# Patient Record
Sex: Male | Born: 1960
Health system: Southern US, Community
[De-identification: ages and names within clinical notes are randomized; demographics above are authoritative.]

## PROBLEM LIST (undated history)

## (undated) DIAGNOSIS — C73 Malignant neoplasm of thyroid gland: Secondary | ICD-10-CM

## (undated) DIAGNOSIS — E785 Hyperlipidemia, unspecified: Secondary | ICD-10-CM

## (undated) DIAGNOSIS — Z8739 Personal history of other diseases of the musculoskeletal system and connective tissue: Secondary | ICD-10-CM

## (undated) DIAGNOSIS — I493 Ventricular premature depolarization: Secondary | ICD-10-CM

## (undated) HISTORY — DX: Personal history of other diseases of the musculoskeletal system and connective tissue: Z87.39

## (undated) HISTORY — DX: Malignant neoplasm of thyroid gland: C73

## (undated) HISTORY — DX: Hyperlipidemia, unspecified: E78.5

## (undated) HISTORY — PX: FINGER SURGERY: SHX640

## (undated) HISTORY — DX: Ventricular premature depolarization: I49.3

---

## 1997-10-01 ENCOUNTER — Ambulatory Visit (HOSPITAL_BASED_OUTPATIENT_CLINIC_OR_DEPARTMENT_OTHER): Admission: RE | Admit: 1997-10-01 | Discharge: 1997-10-01 | Payer: Self-pay | Admitting: Anesthesiology

## 1998-05-24 HISTORY — PX: THYROID SURGERY: SHX805

## 1998-08-19 ENCOUNTER — Ambulatory Visit (HOSPITAL_COMMUNITY): Admission: RE | Admit: 1998-08-19 | Discharge: 1998-08-19 | Payer: Self-pay

## 1998-09-08 ENCOUNTER — Ambulatory Visit (HOSPITAL_COMMUNITY): Admission: RE | Admit: 1998-09-08 | Discharge: 1998-09-10 | Payer: Self-pay

## 1998-09-14 ENCOUNTER — Emergency Department (HOSPITAL_COMMUNITY): Admission: EM | Admit: 1998-09-14 | Discharge: 1998-09-14 | Payer: Self-pay | Admitting: Emergency Medicine

## 1998-09-15 ENCOUNTER — Ambulatory Visit (HOSPITAL_COMMUNITY): Admission: RE | Admit: 1998-09-15 | Discharge: 1998-09-15 | Payer: Self-pay | Admitting: General Surgery

## 1998-10-28 ENCOUNTER — Ambulatory Visit (HOSPITAL_COMMUNITY): Admission: RE | Admit: 1998-10-28 | Discharge: 1998-10-28 | Payer: Self-pay | Admitting: Endocrinology

## 1998-10-31 ENCOUNTER — Encounter: Payer: Self-pay | Admitting: Endocrinology

## 1998-11-07 ENCOUNTER — Encounter: Payer: Self-pay | Admitting: Endocrinology

## 1998-11-07 ENCOUNTER — Ambulatory Visit (HOSPITAL_COMMUNITY): Admission: RE | Admit: 1998-11-07 | Discharge: 1998-11-07 | Payer: Self-pay | Admitting: Endocrinology

## 1999-10-26 ENCOUNTER — Ambulatory Visit (HOSPITAL_COMMUNITY): Admission: RE | Admit: 1999-10-26 | Discharge: 1999-10-26 | Payer: Self-pay | Admitting: *Deleted

## 1999-10-30 ENCOUNTER — Ambulatory Visit (HOSPITAL_COMMUNITY): Admission: RE | Admit: 1999-10-30 | Discharge: 1999-10-30 | Payer: Self-pay | Admitting: Endocrinology

## 2000-12-12 ENCOUNTER — Encounter: Payer: Self-pay | Admitting: Family Medicine

## 2000-12-12 ENCOUNTER — Ambulatory Visit (HOSPITAL_COMMUNITY): Admission: RE | Admit: 2000-12-12 | Discharge: 2000-12-12 | Payer: Self-pay | Admitting: Family Medicine

## 2002-02-05 ENCOUNTER — Encounter: Payer: Self-pay | Admitting: Family Medicine

## 2002-02-05 ENCOUNTER — Ambulatory Visit (HOSPITAL_COMMUNITY): Admission: RE | Admit: 2002-02-05 | Discharge: 2002-02-05 | Payer: Self-pay | Admitting: Family Medicine

## 2003-06-10 ENCOUNTER — Ambulatory Visit (HOSPITAL_COMMUNITY): Admission: RE | Admit: 2003-06-10 | Discharge: 2003-06-10 | Payer: Self-pay | Admitting: Family Medicine

## 2003-07-30 ENCOUNTER — Ambulatory Visit (HOSPITAL_COMMUNITY): Admission: RE | Admit: 2003-07-30 | Discharge: 2003-07-30 | Payer: Self-pay | Admitting: Endocrinology

## 2003-07-30 IMAGING — US US SOFT TISSUE HEAD/NECK
1 series · 14 of 17 positions shown · non-contrast
Comparison: none

CLINICAL DATA: History of thyroidectomy for thyroid cancer. 
 ULTRASOUND SOFT TISSUE NECK
 No definite residual thyroid tissue is identified.  No neck masses or enlarged lymph nodes are seen.  
 IMPRESSION
 Unremarkable soft tissue examination of the neck.  No evidence for residual thyroid tissue or thyroid bed mass or adenopathy.

[Series 1: unknown · 0.09mm/px · 14 of 17 slices shown]
[im 1/17]
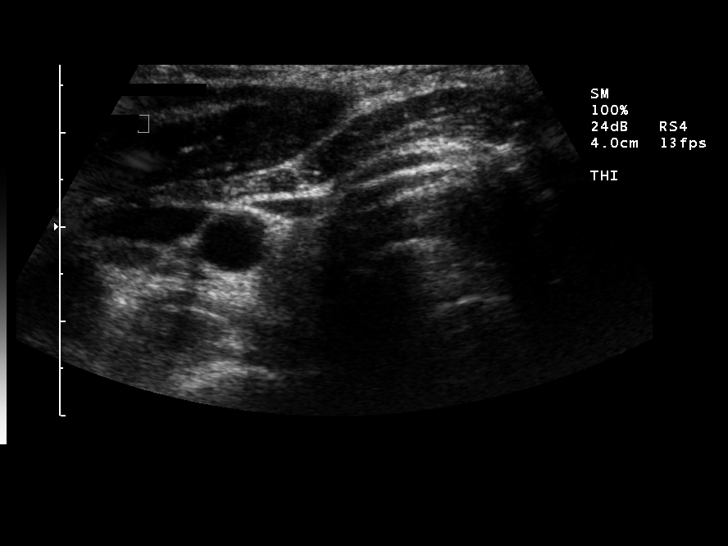
[im 2/17]
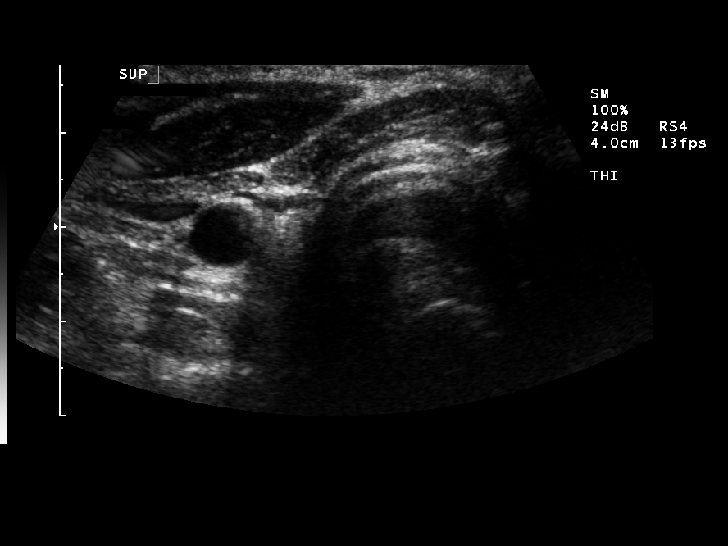
[im 4/17]
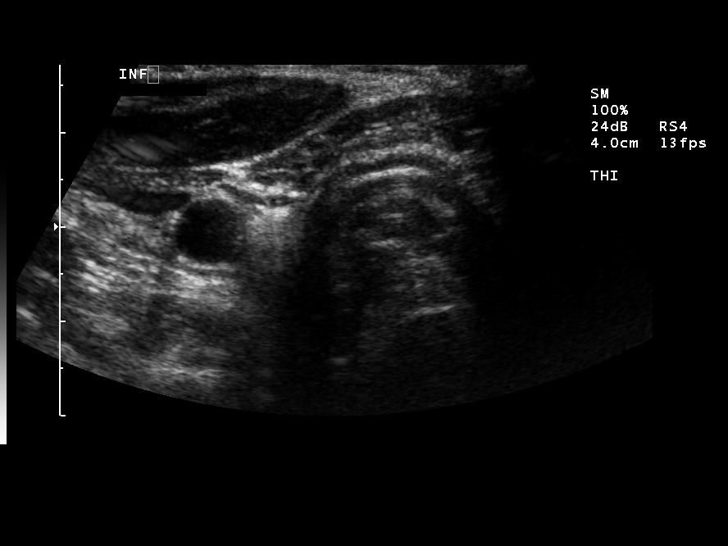
[im 5/17]
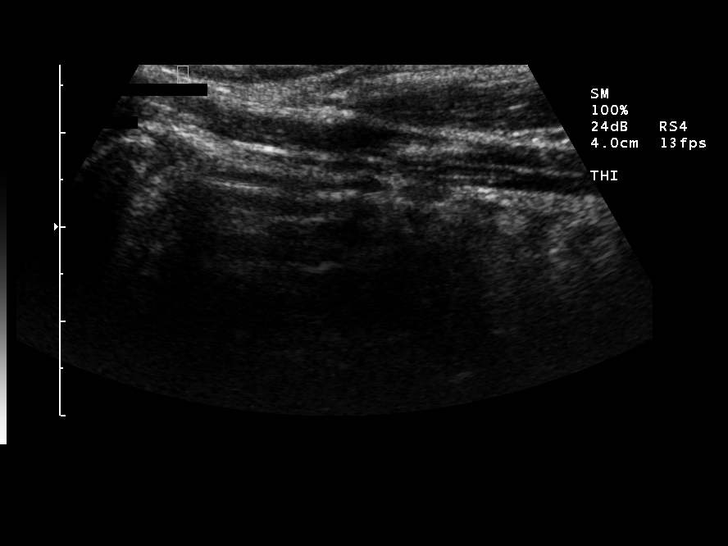
[im 6/17]
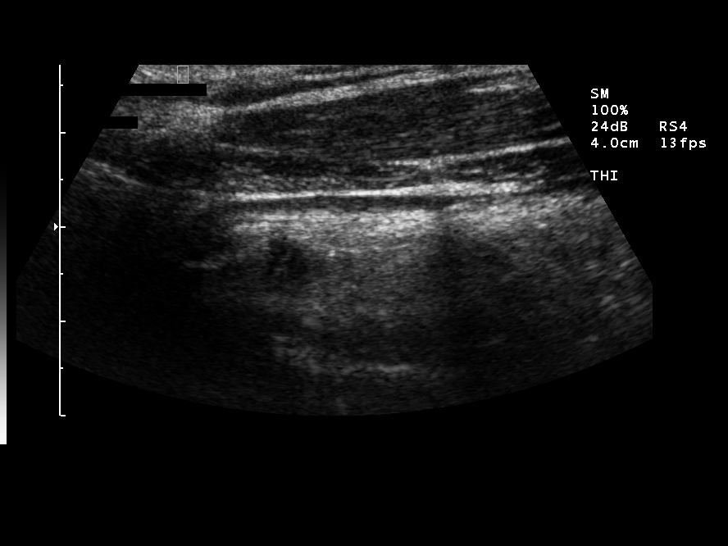
[im 7/17]
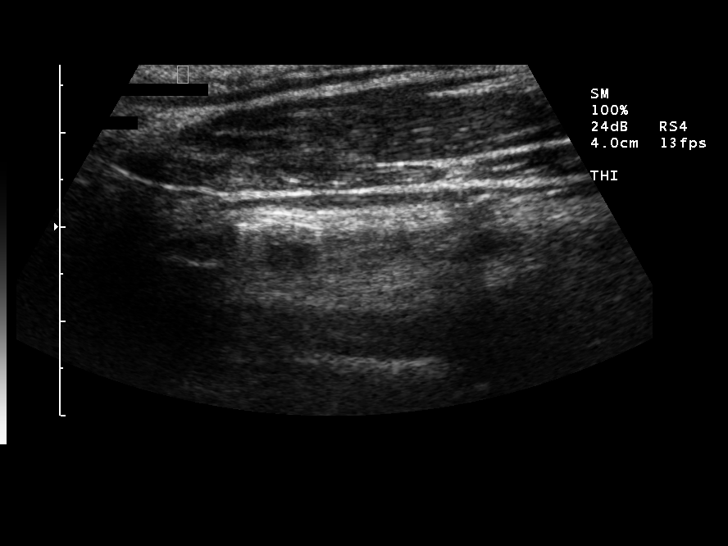
[im 8/17]
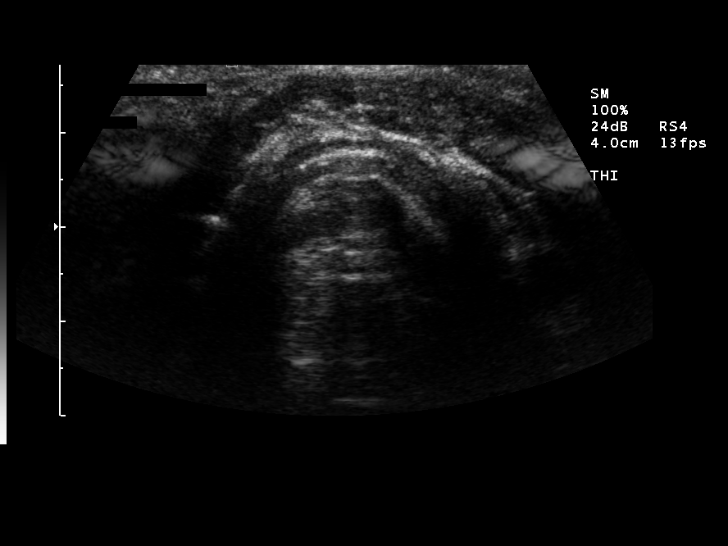
[im 10/17]
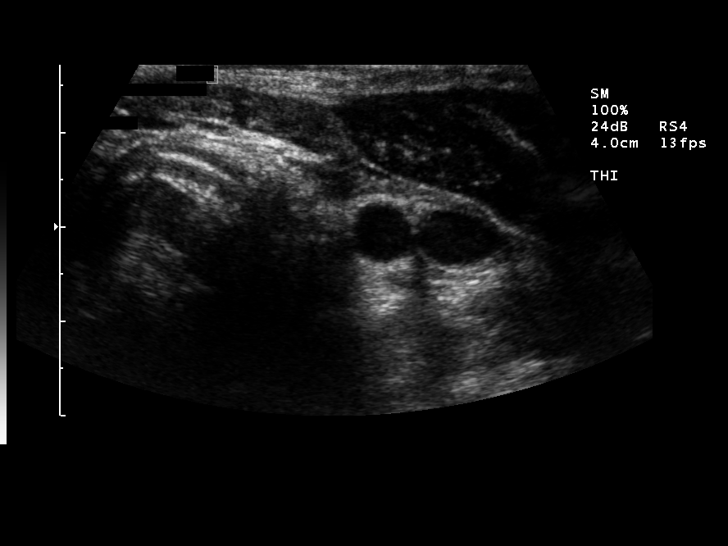
[im 11/17]
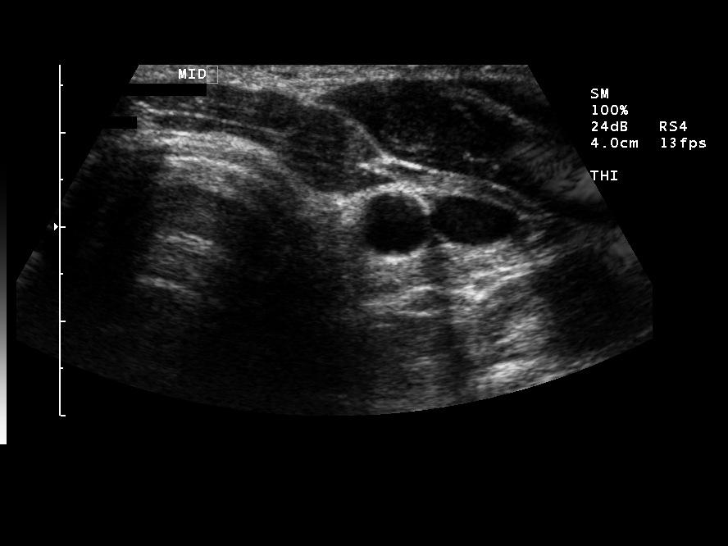
[im 12/17]
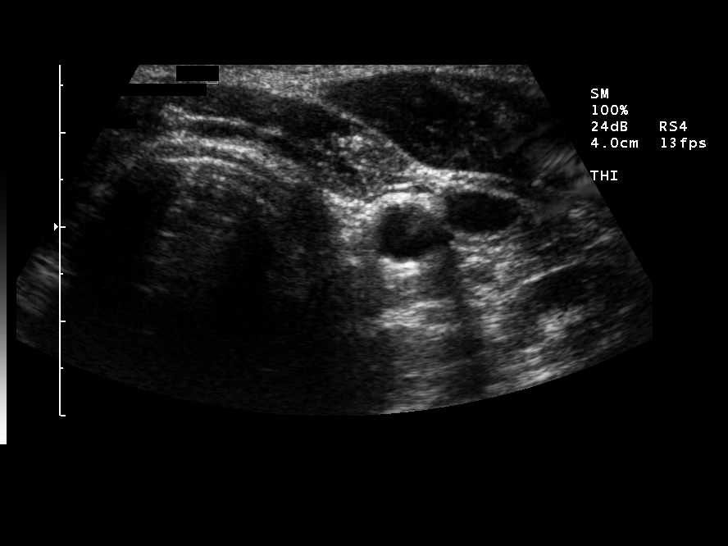
[im 13/17]
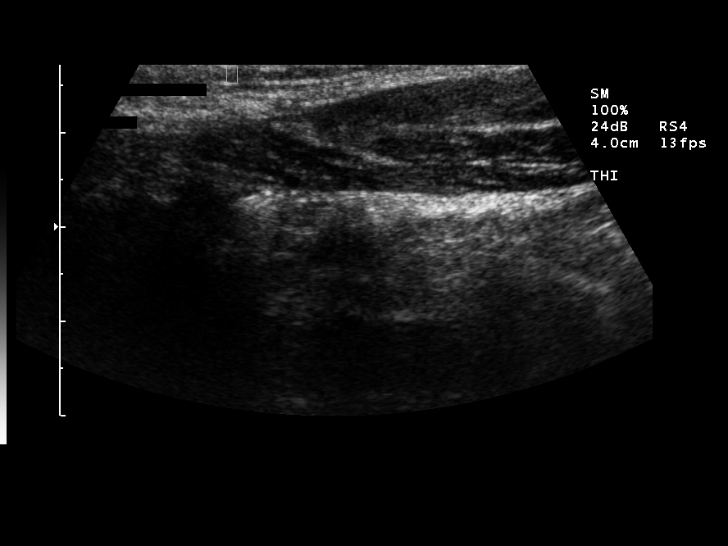
[im 14/17]
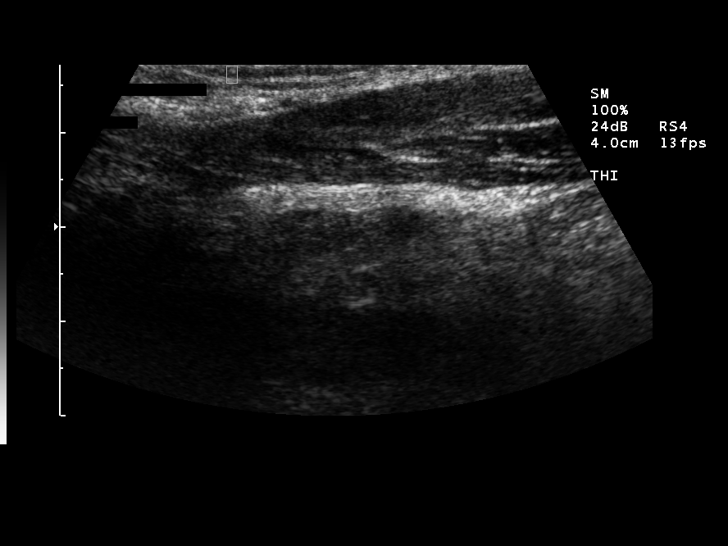
[im 16/17]
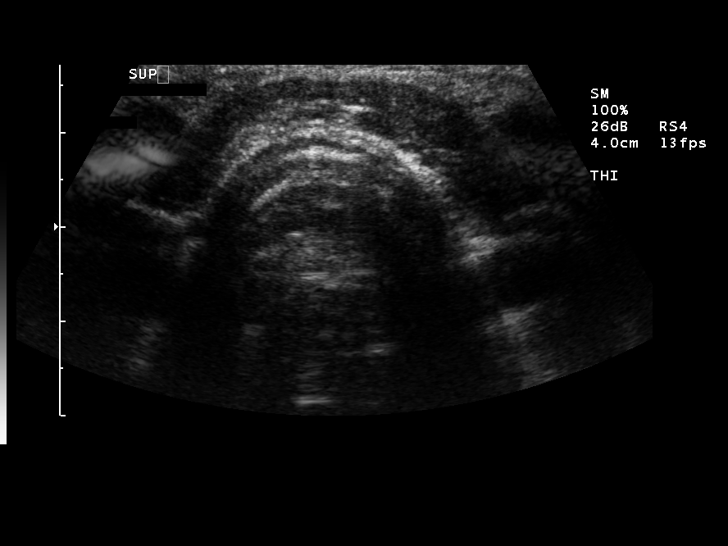
[im 17/17]
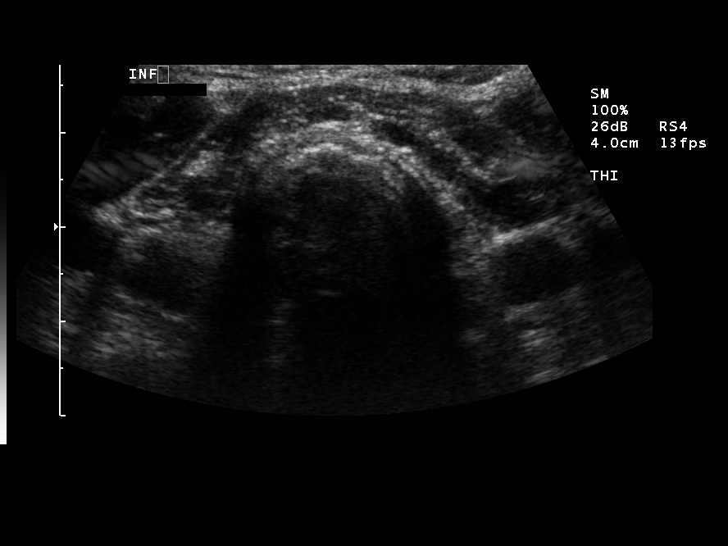

[14 of 17 positions shown; findings below may reference images not displayed]

## 2005-02-01 ENCOUNTER — Encounter (HOSPITAL_COMMUNITY): Admission: RE | Admit: 2005-02-01 | Discharge: 2005-02-16 | Payer: Self-pay | Admitting: Endocrinology

## 2005-05-24 HISTORY — PX: SHOULDER ARTHROSCOPY: SHX128

## 2006-10-12 ENCOUNTER — Encounter: Admission: RE | Admit: 2006-10-12 | Discharge: 2006-10-12 | Payer: Self-pay | Admitting: Endocrinology

## 2008-12-25 ENCOUNTER — Emergency Department (HOSPITAL_BASED_OUTPATIENT_CLINIC_OR_DEPARTMENT_OTHER): Admission: EM | Admit: 2008-12-25 | Discharge: 2008-12-25 | Payer: Self-pay | Admitting: Emergency Medicine

## 2010-06-13 ENCOUNTER — Encounter: Payer: Self-pay | Admitting: Family Medicine

## 2010-06-14 ENCOUNTER — Encounter: Payer: Self-pay | Admitting: Endocrinology

## 2010-06-15 ENCOUNTER — Encounter: Payer: Self-pay | Admitting: Endocrinology

## 2010-08-29 LAB — BASIC METABOLIC PANEL
BUN: 16 mg/dL (ref 6–23)
Chloride: 102 mEq/L (ref 96–112)
Creatinine, Ser: 0.9 mg/dL (ref 0.4–1.5)
Glucose, Bld: 125 mg/dL — ABNORMAL HIGH (ref 70–99)
Potassium: 3.7 mEq/L (ref 3.5–5.1)

## 2012-11-20 ENCOUNTER — Other Ambulatory Visit: Payer: Self-pay | Admitting: Endocrinology

## 2012-11-20 DIAGNOSIS — E890 Postprocedural hypothyroidism: Secondary | ICD-10-CM

## 2012-11-23 ENCOUNTER — Other Ambulatory Visit: Payer: Self-pay | Admitting: Endocrinology

## 2012-11-23 ENCOUNTER — Ambulatory Visit (INDEPENDENT_AMBULATORY_CARE_PROVIDER_SITE_OTHER): Payer: BC Managed Care – PPO

## 2012-11-23 ENCOUNTER — Ambulatory Visit: Payer: Self-pay

## 2012-11-23 DIAGNOSIS — R7302 Impaired glucose tolerance (oral): Secondary | ICD-10-CM

## 2012-11-23 DIAGNOSIS — R7309 Other abnormal glucose: Secondary | ICD-10-CM

## 2012-11-23 DIAGNOSIS — E890 Postprocedural hypothyroidism: Secondary | ICD-10-CM

## 2012-11-23 LAB — TSH: TSH: 0.42 u[IU]/mL (ref 0.35–5.50)

## 2012-11-23 LAB — T4, FREE: Free T4: 0.76 ng/dL (ref 0.60–1.60)

## 2012-12-04 ENCOUNTER — Telehealth: Payer: Self-pay | Admitting: Endocrinology

## 2012-12-05 NOTE — Telephone Encounter (Signed)
Please see below and advise.

## 2012-12-05 NOTE — Telephone Encounter (Signed)
I do not believe he has seen me for followup, please let him know that we normally discuss results at the office visit

## 2012-12-12 NOTE — Telephone Encounter (Signed)
Jan, please call him for follow up appt, to discuss lab results

## 2012-12-13 ENCOUNTER — Telehealth: Payer: Self-pay | Admitting: Endocrinology

## 2012-12-13 ENCOUNTER — Other Ambulatory Visit: Payer: Self-pay | Admitting: Endocrinology

## 2012-12-13 ENCOUNTER — Encounter: Payer: Self-pay | Admitting: *Deleted

## 2012-12-13 NOTE — Telephone Encounter (Signed)
Results given to pt

## 2012-12-13 NOTE — Telephone Encounter (Signed)
His labs show TSH low normal, instead of taking extra  tablet once a week he will take 2 tablets of the 60 mg Armour Thyroid daily A1c was normal, Followup in 6 months

## 2013-03-29 ENCOUNTER — Other Ambulatory Visit: Payer: Self-pay

## 2013-05-03 ENCOUNTER — Other Ambulatory Visit: Payer: Self-pay | Admitting: *Deleted

## 2013-05-03 MED ORDER — THYROID 60 MG PO TABS: ORAL_TABLET | ORAL | Status: DC

## 2013-07-23 ENCOUNTER — Telehealth: Payer: Self-pay | Admitting: Endocrinology

## 2013-07-23 NOTE — Telephone Encounter (Signed)
Pt would like records sent to him at [place] [address] [place] [phone]

## 2013-07-27 ENCOUNTER — Telehealth: Payer: Self-pay | Admitting: Endocrinology

## 2013-07-27 NOTE — Telephone Encounter (Signed)
Patient is requesting lab results.

## 2013-07-27 NOTE — Telephone Encounter (Signed)
Pt would like lab results  Please advise  Call Back: [phone]  Thank You :)

## 2013-07-30 NOTE — Telephone Encounter (Signed)
He has not had any labs. He has asked for transfer to another physician I believe

## 2013-08-01 ENCOUNTER — Telehealth: Payer: Self-pay | Admitting: Endocrinology

## 2013-08-01 NOTE — Telephone Encounter (Signed)
Wanting to know if he can take a TSH test   Please advise patient   Call back:[phone]  Thank You :)

## 2013-11-07 ENCOUNTER — Other Ambulatory Visit: Payer: Self-pay | Admitting: Endocrinology

## 2013-11-08 ENCOUNTER — Encounter: Payer: Self-pay | Admitting: *Deleted

## 2013-12-07 ENCOUNTER — Encounter: Payer: Self-pay | Admitting: Cardiology

## 2014-02-08 ENCOUNTER — Ambulatory Visit: Payer: BC Managed Care – PPO | Admitting: Cardiology

## 2014-03-19 ENCOUNTER — Ambulatory Visit (INDEPENDENT_AMBULATORY_CARE_PROVIDER_SITE_OTHER): Payer: BC Managed Care – PPO | Admitting: Cardiology

## 2014-03-19 ENCOUNTER — Encounter: Payer: Self-pay | Admitting: Cardiology

## 2014-03-19 VITALS — BP 110/80 | HR 54 | Ht 69.5 in | Wt 175.0 lb

## 2014-03-19 DIAGNOSIS — I493 Ventricular premature depolarization: Secondary | ICD-10-CM | POA: Insufficient documentation

## 2014-03-19 DIAGNOSIS — E7800 Pure hypercholesterolemia, unspecified: Secondary | ICD-10-CM | POA: Insufficient documentation

## 2014-03-19 DIAGNOSIS — R002 Palpitations: Secondary | ICD-10-CM | POA: Insufficient documentation

## 2014-03-19 DIAGNOSIS — Z8249 Family history of ischemic heart disease and other diseases of the circulatory system: Secondary | ICD-10-CM

## 2014-03-19 NOTE — Patient Instructions (Signed)
The current medical regimen is effective;  continue present plan and medications.  Follow up in 2 years with Dr. Marlou Porch.  You will receive a letter in the mail 2 months before you are due.  Please call us when you receive this letter to schedule your follow up appointment.

## 2014-03-19 NOTE — Progress Notes (Signed)
[place]. [address], [address] [place], [place]  [address] Phone: [phone] Fax:  [phone]  Date:  03/19/2014   ID:  Jim Williams, DOB 1960-12-09, MRN [id number]  PCP:  Jim Height, MD   History of Present Illness: Jim Williams is a 53 y.o. male here for follow-up, premature ventricular contractions. Father had bypass surgery at age 42. He does not have diabetes, hypertension, hyperlipidemia, no tobacco use. Echocardiogram in 2010 showed normal ejection fraction with trace tricuspid regurgitation and exercise treadmill test at that time was low risk, 10 minutes with no ECG changes.  Previously noting late afternoon, early evening intermittent palpitations, felt a "harder beat" in neck. Intermittent. These were quite annoying. No change in caffeine use. His wife underwent aortic valve replacement and had a periprocedural MI with subsequent RCA stent, bicuspid valve, October 2011.  Previously, excellent HDL of 84, LDL of 123-86. He is an avid runner.    Wt Readings from Last 3 Encounters:  03/19/14 175 lb (79.379 kg)     Past Medical History  Diagnosis Date  . Thyroid cancer   . H/O osteopenia     mild  . PVC (premature ventricular contraction)   . Hyperlipidemia     Past Surgical History  Procedure Laterality Date  . Thyroid surgery  2000  . Shoulder arthroscopy Left 2007  . Finger surgery Left     middle finger    Current Outpatient Prescriptions  Medication Sig Dispense Refill  . ARMOUR THYROID 60 MG tablet TAKE 2 TABLETS BY MOUTH DAILY AND AN EXTRA 1 TABLET ONCE A WEEK  64 tablet  0  . aspirin 81 MG tablet Take 81 mg by mouth daily.      . Calcium Carbonate-Vitamin D (CALCIUM-VITAMIN D) 500-200 MG-UNIT per tablet Take 1 tablet by mouth daily.      . cholecalciferol (VITAMIN D) 1000 UNITS tablet Take 1,000 Units by mouth daily.      . DHA-EPA-Coenzyme Q10-Vitamin E (COQ-10 & FISH OIL PO) Take by mouth.      . MULTIPLE VITAMIN PO Take by mouth.         No current facility-administered medications for this visit.    Allergies:    Allergies  Allergen Reactions  . Amoxicillin Rash    Social History:  The patient  reports that he has never smoked. He has never used smokeless tobacco. He reports that he drinks alcohol. He reports that he does not use illicit drugs.   Family History  Problem Relation Age of Onset  . Diabetes Father   . CAD Father     CABG age 69  . Transient ischemic attack Father     multiple  . Thyroid nodules Mother   . Pulmonary embolism Mother     ROS:  Please see the history of present illness.   Denies any syncope, bleeding, orthopnea, PND   All other systems reviewed and negative.   PHYSICAL EXAM: VS:  BP 110/80  Pulse 54  Ht 5' 9.5" (1.765 m)  Wt 175 lb (79.379 kg)  BMI 25.48 kg/m2 Well nourished, well developed, in no acute distress HEENT: normal, [place]/AT, EOMI Neck: no JVD, normal carotid upstroke, no bruit Cardiac:  normal S1, S2; RRR; no murmur Lungs:  clear to auscultation bilaterally, no wheezing, rhonchi or rales Abd: soft, nontender, no hepatomegaly, no bruits Ext: no edema, 2+ distal pulses Skin: warm and dry GU: deferred Neuro: no focal abnormalities noted, AAO x  3  EKG: 03/19/14-sinus bradycardia rate 54 with no other abnormalities. 02/07/12- Sinus rhythm, 60, no other abnormalities.    Labs: LDL has been less than 100, HDL upper 70s, 80. Prior notes reviewed    ASSESSMENT AND PLAN:  1. Palpitations/PVCs-no longer significant. Overall doing well. No high risk symptoms. 2. Hypercholesterolemia-his total cholesterol number is elevated secondary to high HDL which is quite protective. Continuing with good diet, exercise. 3. Family history of coronary artery disease-father, MI/bypass age 76, he also had carotid endarterectomy. 4. 2 year follow up.  Signed, Jim Furbish, MD [facility]  03/19/2014 8:38 AM

## 2017-04-21 DIAGNOSIS — L814 Other melanin hyperpigmentation: Secondary | ICD-10-CM | POA: Diagnosis not present

## 2017-04-21 DIAGNOSIS — D1801 Hemangioma of skin and subcutaneous tissue: Secondary | ICD-10-CM | POA: Diagnosis not present

## 2017-04-21 DIAGNOSIS — L821 Other seborrheic keratosis: Secondary | ICD-10-CM | POA: Diagnosis not present

## 2017-04-21 DIAGNOSIS — Z86018 Personal history of other benign neoplasm: Secondary | ICD-10-CM | POA: Diagnosis not present

## 2017-05-03 DIAGNOSIS — M8589 Other specified disorders of bone density and structure, multiple sites: Secondary | ICD-10-CM | POA: Diagnosis not present

## 2017-05-03 DIAGNOSIS — Z136 Encounter for screening for cardiovascular disorders: Secondary | ICD-10-CM | POA: Diagnosis not present

## 2017-05-03 DIAGNOSIS — Z8679 Personal history of other diseases of the circulatory system: Secondary | ICD-10-CM | POA: Diagnosis not present

## 2017-05-03 DIAGNOSIS — I6529 Occlusion and stenosis of unspecified carotid artery: Secondary | ICD-10-CM | POA: Diagnosis not present

## 2017-05-03 DIAGNOSIS — M8580 Other specified disorders of bone density and structure, unspecified site: Secondary | ICD-10-CM | POA: Diagnosis not present

## 2017-08-31 DIAGNOSIS — E039 Hypothyroidism, unspecified: Secondary | ICD-10-CM | POA: Diagnosis not present

## 2017-10-25 DIAGNOSIS — E890 Postprocedural hypothyroidism: Secondary | ICD-10-CM | POA: Diagnosis not present

## 2018-01-12 ENCOUNTER — Ambulatory Visit (INDEPENDENT_AMBULATORY_CARE_PROVIDER_SITE_OTHER): Payer: BLUE CROSS/BLUE SHIELD | Admitting: Cardiology

## 2018-01-12 ENCOUNTER — Encounter: Payer: Self-pay | Admitting: Cardiology

## 2018-01-12 VITALS — BP 110/76 | HR 60 | Ht 69.5 in | Wt 172.6 lb

## 2018-01-12 DIAGNOSIS — R0989 Other specified symptoms and signs involving the circulatory and respiratory systems: Secondary | ICD-10-CM | POA: Diagnosis not present

## 2018-01-12 DIAGNOSIS — R002 Palpitations: Secondary | ICD-10-CM | POA: Diagnosis not present

## 2018-01-12 NOTE — Progress Notes (Signed)
Cardiology Office Note:    Date:  01/12/2018   ID:  Jim Williams, DOB 04-Feb-1961, MRN [id number]  PCP:  Jim Cruel, MD  Cardiologist:  No primary care provider on file.   Referring MD: Jim Cruel, MD     History of Present Illness:    Jim Williams is a 57 y.o. male here for evaluation of premature ventricular contractions/family history of coronary artery disease with his father having bypass surgery at age 63 and pulsatile abdominal aorta on physical exam at the request of Dr. Harrington Challenger.  I saw him last approximately 4 years ago.  Back in 2010 echocardiogram showed normal ejection fraction with trace TR and exercise treadmill test was low risk, 10 minutes with no ischemic changes.  Previously he was noting intermittent palpitations felt a "harder beat "in the neck intermittent that were very annoying to him.  Caffeine use did not seem to changes.  His wife has had cardiac issues with aortic valve replacement and had a periprocedural myocardial infarction with subsequent RCA stent.  This was in October 2011.  Had flight physical a few months ago.  Flies a [place].  During physical exam, observed a palpable abdominal aorta, pulsatile.  He wanted to make sure that he did not have any signs of aneurysm.  In regards to his palpitations, felt some PVCs that were coming and going, few months ago checked a TSH which was quite low, adjusted his thyroid dose, post thyroidectomy, thyroid cancer, and this has resolved.  Doing well.  No fevers chills nausea vomiting syncope bleeding.   We also discussed primary prevention with aspirin and given his relatively low overall risk, we decided to stop his aspirin to reduce bleeding risk overall.  Several primary prevention aspirin studies have come out recently and we discussed.   Past Medical History:  Diagnosis Date  . H/O osteopenia    mild  . Hyperlipidemia   . PVC (premature ventricular contraction)   . Thyroid cancer  [facility])     Past Surgical History:  Procedure Laterality Date  . FINGER SURGERY Left    middle finger  . SHOULDER ARTHROSCOPY Left 2007  . THYROID SURGERY  2000    Current Medications: Current Meds  Medication Sig  . ARMOUR THYROID 60 MG tablet TAKE 2 TABLETS BY MOUTH DAILY AND AN EXTRA 1 TABLET ONCE A WEEK  . Calcium Carbonate-Vitamin D (CALCIUM-VITAMIN D) 500-200 MG-UNIT per tablet Take 1 tablet by mouth daily.  . cholecalciferol (VITAMIN D) 1000 UNITS tablet Take 1,000 Units by mouth daily.  . DHA-EPA-Coenzyme Q10-Vitamin E (COQ-10 & FISH OIL PO) Take by mouth.  . MULTIPLE VITAMIN PO Take by mouth.  . [DISCONTINUED] aspirin 81 MG tablet Take 81 mg by mouth daily.     Allergies:   Amoxicillin   Social History   Socioeconomic History  . Marital status: Single    Spouse name: Not on file  . Number of children: Not on file  . Years of education: Not on file  . Highest education level: Not on file  Occupational History  . Not on file  Social Needs  . Financial resource strain: Not on file  . Food insecurity:    Worry: Not on file    Inability: Not on file  . Transportation needs:    Medical: Not on file    Non-medical: Not on file  Tobacco Use  . Smoking status: Never Smoker  . Smokeless tobacco: Never Used  Substance and Sexual Activity  . Alcohol use: Yes    Comment: 5-7 WEEKLY  . Drug use: No  . Sexual activity: Not on file  Lifestyle  . Physical activity:    Days per week: Not on file    Minutes per session: Not on file  . Stress: Not on file  Relationships  . Social connections:    Talks on phone: Not on file    Gets together: Not on file    Attends religious service: Not on file    Active member of club or organization: Not on file    Attends meetings of clubs or organizations: Not on file    Relationship status: Not on file  Other Topics Concern  . Not on file  Social History Narrative  . Not on file     Family History: The patient's family  history includes CAD in his father; Diabetes in his father; Pulmonary embolism in his mother; Thyroid nodules in his mother; Transient ischemic attack in his father.  ROS:   Please see the history of present illness.     All other systems reviewed and are negative.  EKGs/Labs/Other Studies Reviewed:    The following studies were reviewed today: Prior office note, EKG, prior lab work reviewed.  LDL 93, HDL 82, hemoglobin A1c 5.3, hemoglobin 14, creatinine 0.96  EKG: 01/12/2018-sinus rhythm 60 with no other abnormalities-03/19/14-sinus bradycardia rate 54 with no other abnormalities. 02/07/12- Sinus rhythm, 60, no other abnormalities  Recent Labs: No results found for requested labs within last 8760 hours.  Recent Lipid Panel No results found for: CHOL, TRIG, HDL, CHOLHDL, VLDL, LDLCALC, LDLDIRECT  Physical Exam:    VS:  BP 110/76   Pulse 60   Ht 5' 9.5" (1.765 m)   Wt 172 lb 9.6 oz (78.3 kg)   BMI 25.12 kg/m     Wt Readings from Last 3 Encounters:  01/12/18 172 lb 9.6 oz (78.3 kg)  03/19/14 175 lb (79.4 kg)     GEN:  Well nourished, well developed in no acute distress HEENT: Normal NECK: No JVD; No carotid bruits LYMPHATICS: No lymphadenopathy CARDIAC: RRR, no murmurs, rubs, gallops RESPIRATORY:  Clear to auscultation without rales, wheezing or rhonchi  ABDOMEN: Soft, non-tender, non-distended, pulsatile abdominal aorta appreciated in mid abdomen. MUSCULOSKELETAL:  No edema; No deformity  SKIN: Warm and dry, mild bruise left forearm NEUROLOGIC:  Alert and oriented x 3 PSYCHIATRIC:  Normal affect   ASSESSMENT:    1. Palpable abdominal aorta   2. Palpitations    PLAN:    In order of problems listed above:  Pulsatile abdominal aorta -We will go ahead and check an abdominal aortic ultrasound to ensure that he does not have any signs of aneurysm.  This may just be a factor of his body habitus.  PVCs/palpitations - No further issues since adjusting his Synthroid.   Doing well.  Hyperlipidemia - Has had high HDL, protective in the past.  Diet, exercise.  Prior thyroid cancer -On Synthroid.  Family history of coronary artery disease with his father having myocardial infarction bypass at age 37, carotid endarterectomy -Continue with prevention efforts.  Diet, exercise.  We decided to stop his aspirin because of recent trial data.  More risk of bleeding than benefit at this point.   Medication Adjustments/Labs and Tests Ordered: Current medicines are reviewed at length with the patient today.  Concerns regarding medicines are outlined above.  Orders Placed This Encounter  Procedures  . EKG 12-Lead  No orders of the defined types were placed in this encounter.   Patient Instructions  Medication Instructions: Please discontinue your Aspirin.  Continue all other medications as listed.  Testing/Procedures: Your physician has requested that you have an abdominal aorta duplex. During this test, an ultrasound is used to evaluate the aorta. Allow 30 minutes for this exam. Do not eat after midnight the day before and avoid carbonated beverages  Follow-Up: Follow up as needed with Dr Marlou Porch.  Thank you for choosing [facility]!!        Signed, Candee Furbish, MD  01/12/2018 10:28 AM    [place]

## 2018-01-12 NOTE — Patient Instructions (Addendum)
Medication Instructions: Please discontinue your Aspirin.  Continue all other medications as listed.  Testing/Procedures: Your physician has requested that you have an abdominal aorta duplex. During this test, an ultrasound is used to evaluate the aorta. Allow 30 minutes for this exam. Do not eat after midnight the day before and avoid carbonated beverages  Follow-Up: Follow up as needed with Dr Marlou Porch.  Thank you for choosing [facility]!!

## 2018-01-19 ENCOUNTER — Inpatient Hospital Stay (HOSPITAL_COMMUNITY): Admission: RE | Admit: 2018-01-19 | Payer: BLUE CROSS/BLUE SHIELD | Source: Ambulatory Visit

## 2018-02-03 ENCOUNTER — Other Ambulatory Visit (HOSPITAL_COMMUNITY): Payer: BLUE CROSS/BLUE SHIELD

## 2018-02-07 ENCOUNTER — Ambulatory Visit (HOSPITAL_COMMUNITY)
Admission: RE | Admit: 2018-02-07 | Discharge: 2018-02-07 | Disposition: A | Discharge status: Home / Self Care | Payer: BLUE CROSS/BLUE SHIELD | Source: Ambulatory Visit | Attending: Cardiology | Admitting: Cardiology

## 2018-02-07 DIAGNOSIS — R0989 Other specified symptoms and signs involving the circulatory and respiratory systems: Secondary | ICD-10-CM | POA: Diagnosis not present

## 2018-02-22 ENCOUNTER — Encounter: Payer: Self-pay | Admitting: *Deleted

## 2018-03-29 DIAGNOSIS — Z0000 Encounter for general adult medical examination without abnormal findings: Secondary | ICD-10-CM | POA: Diagnosis not present

## 2018-03-29 DIAGNOSIS — R531 Weakness: Secondary | ICD-10-CM | POA: Diagnosis not present

## 2018-03-31 DIAGNOSIS — Z0000 Encounter for general adult medical examination without abnormal findings: Secondary | ICD-10-CM | POA: Diagnosis not present

## 2018-03-31 DIAGNOSIS — Z23 Encounter for immunization: Secondary | ICD-10-CM | POA: Diagnosis not present

## 2018-04-25 DIAGNOSIS — D225 Melanocytic nevi of trunk: Secondary | ICD-10-CM | POA: Diagnosis not present

## 2018-04-25 DIAGNOSIS — L821 Other seborrheic keratosis: Secondary | ICD-10-CM | POA: Diagnosis not present

## 2018-04-25 DIAGNOSIS — L814 Other melanin hyperpigmentation: Secondary | ICD-10-CM | POA: Diagnosis not present

## 2018-04-25 DIAGNOSIS — Z86018 Personal history of other benign neoplasm: Secondary | ICD-10-CM | POA: Diagnosis not present

## 2018-07-03 DIAGNOSIS — Z23 Encounter for immunization: Secondary | ICD-10-CM | POA: Diagnosis not present

## 2018-07-03 DIAGNOSIS — E890 Postprocedural hypothyroidism: Secondary | ICD-10-CM | POA: Diagnosis not present

## 2018-07-03 DIAGNOSIS — Z0000 Encounter for general adult medical examination without abnormal findings: Secondary | ICD-10-CM | POA: Diagnosis not present

## 2018-07-11 DIAGNOSIS — R972 Elevated prostate specific antigen [PSA]: Secondary | ICD-10-CM | POA: Diagnosis not present

## 2018-07-11 DIAGNOSIS — R3914 Feeling of incomplete bladder emptying: Secondary | ICD-10-CM | POA: Diagnosis not present

## 2018-08-11 DIAGNOSIS — R3914 Feeling of incomplete bladder emptying: Secondary | ICD-10-CM | POA: Diagnosis not present

## 2018-10-27 DIAGNOSIS — R972 Elevated prostate specific antigen [PSA]: Secondary | ICD-10-CM | POA: Diagnosis not present

## 2018-11-28 DIAGNOSIS — R972 Elevated prostate specific antigen [PSA]: Secondary | ICD-10-CM | POA: Diagnosis not present

## 2018-11-28 DIAGNOSIS — R3914 Feeling of incomplete bladder emptying: Secondary | ICD-10-CM | POA: Diagnosis not present

## 2018-12-26 DIAGNOSIS — N50812 Left testicular pain: Secondary | ICD-10-CM | POA: Diagnosis not present

## 2018-12-26 DIAGNOSIS — N50811 Right testicular pain: Secondary | ICD-10-CM | POA: Diagnosis not present

## 2018-12-26 DIAGNOSIS — R3914 Feeling of incomplete bladder emptying: Secondary | ICD-10-CM | POA: Diagnosis not present

## 2019-03-02 DIAGNOSIS — R3914 Feeling of incomplete bladder emptying: Secondary | ICD-10-CM | POA: Diagnosis not present

## 2019-04-09 DIAGNOSIS — R3914 Feeling of incomplete bladder emptying: Secondary | ICD-10-CM | POA: Diagnosis not present

## 2019-04-09 DIAGNOSIS — N401 Enlarged prostate with lower urinary tract symptoms: Secondary | ICD-10-CM | POA: Diagnosis not present

## 2019-04-11 DIAGNOSIS — N401 Enlarged prostate with lower urinary tract symptoms: Secondary | ICD-10-CM | POA: Diagnosis not present

## 2019-04-11 DIAGNOSIS — R3914 Feeling of incomplete bladder emptying: Secondary | ICD-10-CM | POA: Diagnosis not present

## 2019-05-02 DIAGNOSIS — L814 Other melanin hyperpigmentation: Secondary | ICD-10-CM | POA: Diagnosis not present

## 2019-05-02 DIAGNOSIS — L820 Inflamed seborrheic keratosis: Secondary | ICD-10-CM | POA: Diagnosis not present

## 2019-05-02 DIAGNOSIS — Z86018 Personal history of other benign neoplasm: Secondary | ICD-10-CM | POA: Diagnosis not present

## 2019-05-02 DIAGNOSIS — D2262 Melanocytic nevi of left upper limb, including shoulder: Secondary | ICD-10-CM | POA: Diagnosis not present

## 2019-05-02 DIAGNOSIS — D225 Melanocytic nevi of trunk: Secondary | ICD-10-CM | POA: Diagnosis not present

## 2019-05-02 DIAGNOSIS — D485 Neoplasm of uncertain behavior of skin: Secondary | ICD-10-CM | POA: Diagnosis not present

## 2019-05-02 DIAGNOSIS — L821 Other seborrheic keratosis: Secondary | ICD-10-CM | POA: Diagnosis not present

## 2019-05-08 DIAGNOSIS — Z0000 Encounter for general adult medical examination without abnormal findings: Secondary | ICD-10-CM | POA: Diagnosis not present

## 2019-05-10 DIAGNOSIS — R3914 Feeling of incomplete bladder emptying: Secondary | ICD-10-CM | POA: Diagnosis not present

## 2019-05-22 DIAGNOSIS — Z0000 Encounter for general adult medical examination without abnormal findings: Secondary | ICD-10-CM | POA: Diagnosis not present

## 2019-05-22 DIAGNOSIS — Z13220 Encounter for screening for lipoid disorders: Secondary | ICD-10-CM | POA: Diagnosis not present

## 2019-05-22 DIAGNOSIS — E890 Postprocedural hypothyroidism: Secondary | ICD-10-CM | POA: Diagnosis not present

## 2019-06-20 DIAGNOSIS — M25511 Pain in right shoulder: Secondary | ICD-10-CM | POA: Diagnosis not present

## 2019-06-20 DIAGNOSIS — S42024A Nondisplaced fracture of shaft of right clavicle, initial encounter for closed fracture: Secondary | ICD-10-CM | POA: Diagnosis not present

## 2019-07-18 DIAGNOSIS — M25512 Pain in left shoulder: Secondary | ICD-10-CM | POA: Diagnosis not present

## 2019-07-18 DIAGNOSIS — S40011D Contusion of right shoulder, subsequent encounter: Secondary | ICD-10-CM | POA: Diagnosis not present

## 2019-07-18 DIAGNOSIS — S42024D Nondisplaced fracture of shaft of right clavicle, subsequent encounter for fracture with routine healing: Secondary | ICD-10-CM | POA: Diagnosis not present

## 2019-08-02 DIAGNOSIS — R3914 Feeling of incomplete bladder emptying: Secondary | ICD-10-CM | POA: Diagnosis not present

## 2019-08-02 DIAGNOSIS — N401 Enlarged prostate with lower urinary tract symptoms: Secondary | ICD-10-CM | POA: Diagnosis not present

## 2019-08-07 DIAGNOSIS — R3914 Feeling of incomplete bladder emptying: Secondary | ICD-10-CM | POA: Diagnosis not present

## 2019-08-07 DIAGNOSIS — R972 Elevated prostate specific antigen [PSA]: Secondary | ICD-10-CM | POA: Diagnosis not present

## 2019-08-07 DIAGNOSIS — N401 Enlarged prostate with lower urinary tract symptoms: Secondary | ICD-10-CM | POA: Diagnosis not present

## 2019-08-10 DIAGNOSIS — L821 Other seborrheic keratosis: Secondary | ICD-10-CM | POA: Diagnosis not present

## 2019-11-02 DIAGNOSIS — E890 Postprocedural hypothyroidism: Secondary | ICD-10-CM | POA: Diagnosis not present

## 2019-11-23 DIAGNOSIS — R350 Frequency of micturition: Secondary | ICD-10-CM | POA: Diagnosis not present

## 2019-11-23 DIAGNOSIS — N401 Enlarged prostate with lower urinary tract symptoms: Secondary | ICD-10-CM | POA: Diagnosis not present

## 2020-05-09 DIAGNOSIS — M8580 Other specified disorders of bone density and structure, unspecified site: Secondary | ICD-10-CM | POA: Diagnosis not present

## 2020-05-09 DIAGNOSIS — Z13220 Encounter for screening for lipoid disorders: Secondary | ICD-10-CM | POA: Diagnosis not present

## 2020-05-09 DIAGNOSIS — E039 Hypothyroidism, unspecified: Secondary | ICD-10-CM | POA: Diagnosis not present

## 2020-05-09 DIAGNOSIS — Z0000 Encounter for general adult medical examination without abnormal findings: Secondary | ICD-10-CM | POA: Diagnosis not present

## 2020-05-09 DIAGNOSIS — M25519 Pain in unspecified shoulder: Secondary | ICD-10-CM | POA: Diagnosis not present

## 2020-05-09 DIAGNOSIS — Z125 Encounter for screening for malignant neoplasm of prostate: Secondary | ICD-10-CM | POA: Diagnosis not present

## 2020-05-09 DIAGNOSIS — Z23 Encounter for immunization: Secondary | ICD-10-CM | POA: Diagnosis not present

## 2020-05-14 DIAGNOSIS — M19012 Primary osteoarthritis, left shoulder: Secondary | ICD-10-CM | POA: Diagnosis not present

## 2020-05-14 DIAGNOSIS — M25511 Pain in right shoulder: Secondary | ICD-10-CM | POA: Diagnosis not present

## 2020-05-20 DIAGNOSIS — D225 Melanocytic nevi of trunk: Secondary | ICD-10-CM | POA: Diagnosis not present

## 2020-05-20 DIAGNOSIS — Z86018 Personal history of other benign neoplasm: Secondary | ICD-10-CM | POA: Diagnosis not present

## 2020-05-20 DIAGNOSIS — L821 Other seborrheic keratosis: Secondary | ICD-10-CM | POA: Diagnosis not present

## 2020-05-20 DIAGNOSIS — L814 Other melanin hyperpigmentation: Secondary | ICD-10-CM | POA: Diagnosis not present

## 2020-05-21 DIAGNOSIS — M8580 Other specified disorders of bone density and structure, unspecified site: Secondary | ICD-10-CM | POA: Diagnosis not present

## 2020-05-21 DIAGNOSIS — M8588 Other specified disorders of bone density and structure, other site: Secondary | ICD-10-CM | POA: Diagnosis not present

## 2020-05-27 DIAGNOSIS — M19012 Primary osteoarthritis, left shoulder: Secondary | ICD-10-CM | POA: Diagnosis not present

## 2020-05-27 DIAGNOSIS — M25511 Pain in right shoulder: Secondary | ICD-10-CM | POA: Diagnosis not present

## 2020-05-27 DIAGNOSIS — M25512 Pain in left shoulder: Secondary | ICD-10-CM | POA: Diagnosis not present

## 2020-05-27 DIAGNOSIS — M6281 Muscle weakness (generalized): Secondary | ICD-10-CM | POA: Diagnosis not present

## 2020-06-04 DIAGNOSIS — R059 Cough, unspecified: Secondary | ICD-10-CM | POA: Diagnosis not present

## 2020-06-04 DIAGNOSIS — U071 COVID-19: Secondary | ICD-10-CM | POA: Diagnosis not present

## 2020-06-04 DIAGNOSIS — R509 Fever, unspecified: Secondary | ICD-10-CM | POA: Diagnosis not present

## 2020-06-26 DIAGNOSIS — M25512 Pain in left shoulder: Secondary | ICD-10-CM | POA: Diagnosis not present

## 2020-06-26 DIAGNOSIS — M6281 Muscle weakness (generalized): Secondary | ICD-10-CM | POA: Diagnosis not present

## 2020-06-26 DIAGNOSIS — M19012 Primary osteoarthritis, left shoulder: Secondary | ICD-10-CM | POA: Diagnosis not present

## 2020-06-26 DIAGNOSIS — M25511 Pain in right shoulder: Secondary | ICD-10-CM | POA: Diagnosis not present

## 2020-07-03 DIAGNOSIS — M25511 Pain in right shoulder: Secondary | ICD-10-CM | POA: Diagnosis not present

## 2020-07-03 DIAGNOSIS — M6281 Muscle weakness (generalized): Secondary | ICD-10-CM | POA: Diagnosis not present

## 2020-07-03 DIAGNOSIS — M25512 Pain in left shoulder: Secondary | ICD-10-CM | POA: Diagnosis not present

## 2020-07-03 DIAGNOSIS — M19012 Primary osteoarthritis, left shoulder: Secondary | ICD-10-CM | POA: Diagnosis not present

## 2020-07-11 DIAGNOSIS — M25511 Pain in right shoulder: Secondary | ICD-10-CM | POA: Diagnosis not present

## 2020-07-11 DIAGNOSIS — M19012 Primary osteoarthritis, left shoulder: Secondary | ICD-10-CM | POA: Diagnosis not present

## 2020-07-11 DIAGNOSIS — M6281 Muscle weakness (generalized): Secondary | ICD-10-CM | POA: Diagnosis not present

## 2020-07-11 DIAGNOSIS — M25512 Pain in left shoulder: Secondary | ICD-10-CM | POA: Diagnosis not present

## 2020-07-18 DIAGNOSIS — M19012 Primary osteoarthritis, left shoulder: Secondary | ICD-10-CM | POA: Diagnosis not present

## 2020-07-18 DIAGNOSIS — M25511 Pain in right shoulder: Secondary | ICD-10-CM | POA: Diagnosis not present

## 2020-07-18 DIAGNOSIS — M6281 Muscle weakness (generalized): Secondary | ICD-10-CM | POA: Diagnosis not present

## 2020-07-18 DIAGNOSIS — Z23 Encounter for immunization: Secondary | ICD-10-CM | POA: Diagnosis not present

## 2020-07-18 DIAGNOSIS — M25512 Pain in left shoulder: Secondary | ICD-10-CM | POA: Diagnosis not present

## 2020-07-24 DIAGNOSIS — M6281 Muscle weakness (generalized): Secondary | ICD-10-CM | POA: Diagnosis not present

## 2020-07-24 DIAGNOSIS — M25512 Pain in left shoulder: Secondary | ICD-10-CM | POA: Diagnosis not present

## 2020-07-24 DIAGNOSIS — M25511 Pain in right shoulder: Secondary | ICD-10-CM | POA: Diagnosis not present

## 2020-07-24 DIAGNOSIS — M19012 Primary osteoarthritis, left shoulder: Secondary | ICD-10-CM | POA: Diagnosis not present

## 2021-01-19 DIAGNOSIS — E039 Hypothyroidism, unspecified: Secondary | ICD-10-CM | POA: Diagnosis not present

## 2021-01-19 DIAGNOSIS — R5383 Other fatigue: Secondary | ICD-10-CM | POA: Diagnosis not present

## 2021-01-19 DIAGNOSIS — H9313 Tinnitus, bilateral: Secondary | ICD-10-CM | POA: Diagnosis not present

## 2022-01-04 ENCOUNTER — Other Ambulatory Visit: Payer: Self-pay | Admitting: Family Medicine

## 2022-01-04 ENCOUNTER — Other Ambulatory Visit (HOSPITAL_BASED_OUTPATIENT_CLINIC_OR_DEPARTMENT_OTHER): Payer: Self-pay | Admitting: Family Medicine

## 2022-01-04 DIAGNOSIS — Z8249 Family history of ischemic heart disease and other diseases of the circulatory system: Secondary | ICD-10-CM

## 2022-01-12 ENCOUNTER — Ambulatory Visit (HOSPITAL_BASED_OUTPATIENT_CLINIC_OR_DEPARTMENT_OTHER)
Admission: RE | Admit: 2022-01-12 | Discharge: 2022-01-12 | Disposition: A | Discharge status: Home / Self Care | Payer: Self-pay | Source: Ambulatory Visit | Attending: Family Medicine | Admitting: Family Medicine

## 2022-01-12 DIAGNOSIS — Z8249 Family history of ischemic heart disease and other diseases of the circulatory system: Secondary | ICD-10-CM | POA: Insufficient documentation
